# Patient Record
Sex: Female | Born: 1960 | Race: Black or African American | Marital: Married | Smoking: Never smoker
Health system: Southern US, Community
[De-identification: ages and names within clinical notes are randomized; demographics above are authoritative.]

---

## 2010-07-28 ENCOUNTER — Other Ambulatory Visit: Payer: Self-pay | Admitting: Internal Medicine

## 2010-07-28 DIAGNOSIS — E049 Nontoxic goiter, unspecified: Secondary | ICD-10-CM

## 2010-07-30 ENCOUNTER — Inpatient Hospital Stay: Admission: RE | Admit: 2010-07-30 | Payer: Self-pay | Source: Ambulatory Visit

## 2010-08-19 ENCOUNTER — Ambulatory Visit
Admission: RE | Admit: 2010-08-19 | Discharge: 2010-08-19 | Disposition: A | Discharge status: Home / Self Care | Payer: Medicaid Other | Source: Ambulatory Visit | Attending: Internal Medicine | Admitting: Internal Medicine

## 2010-08-19 DIAGNOSIS — E049 Nontoxic goiter, unspecified: Secondary | ICD-10-CM

## 2011-07-26 ENCOUNTER — Other Ambulatory Visit: Payer: Self-pay | Admitting: Internal Medicine

## 2011-07-26 DIAGNOSIS — Z1231 Encounter for screening mammogram for malignant neoplasm of breast: Secondary | ICD-10-CM

## 2011-08-17 ENCOUNTER — Ambulatory Visit: Payer: Medicaid Other

## 2011-08-25 ENCOUNTER — Ambulatory Visit
Admission: RE | Admit: 2011-08-25 | Discharge: 2011-08-25 | Disposition: A | Discharge status: Home / Self Care | Payer: Medicaid Other | Source: Ambulatory Visit | Attending: Internal Medicine | Admitting: Internal Medicine

## 2011-08-25 DIAGNOSIS — Z1231 Encounter for screening mammogram for malignant neoplasm of breast: Secondary | ICD-10-CM

## 2012-10-24 ENCOUNTER — Other Ambulatory Visit: Payer: Self-pay

## 2012-10-24 DIAGNOSIS — Z1231 Encounter for screening mammogram for malignant neoplasm of breast: Secondary | ICD-10-CM

## 2012-11-23 ENCOUNTER — Ambulatory Visit: Payer: Medicaid Other

## 2012-12-13 ENCOUNTER — Ambulatory Visit
Admission: RE | Admit: 2012-12-13 | Discharge: 2012-12-13 | Disposition: A | Discharge status: Home / Self Care | Payer: Medicaid Other | Source: Ambulatory Visit

## 2012-12-13 DIAGNOSIS — Z1231 Encounter for screening mammogram for malignant neoplasm of breast: Secondary | ICD-10-CM

## 2012-12-14 ENCOUNTER — Ambulatory Visit: Payer: Medicaid Other

## 2013-03-07 ENCOUNTER — Other Ambulatory Visit (HOSPITAL_COMMUNITY): Payer: Self-pay | Admitting: Internal Medicine

## 2013-03-07 ENCOUNTER — Ambulatory Visit (HOSPITAL_COMMUNITY)
Admission: RE | Admit: 2013-03-07 | Discharge: 2013-03-07 | Disposition: A | Discharge status: Home / Self Care | Payer: Medicaid Other | Source: Ambulatory Visit | Attending: Internal Medicine | Admitting: Internal Medicine

## 2013-03-07 DIAGNOSIS — M549 Dorsalgia, unspecified: Secondary | ICD-10-CM | POA: Insufficient documentation

## 2013-03-07 DIAGNOSIS — M545 Low back pain: Secondary | ICD-10-CM

## 2014-03-02 ENCOUNTER — Encounter (HOSPITAL_COMMUNITY): Payer: Self-pay | Admitting: Emergency Medicine

## 2014-03-02 ENCOUNTER — Emergency Department (HOSPITAL_COMMUNITY)
Admission: EM | Admit: 2014-03-02 | Discharge: 2014-03-03 | Disposition: A | Discharge status: Home / Self Care | Payer: Medicaid Other | Attending: Emergency Medicine | Admitting: Emergency Medicine

## 2014-03-02 DIAGNOSIS — R531 Weakness: Secondary | ICD-10-CM | POA: Diagnosis not present

## 2014-03-02 DIAGNOSIS — B349 Viral infection, unspecified: Secondary | ICD-10-CM | POA: Insufficient documentation

## 2014-03-02 DIAGNOSIS — R740 Nonspecific elevation of levels of transaminase and lactic acid dehydrogenase [LDH]: Secondary | ICD-10-CM | POA: Diagnosis not present

## 2014-03-02 DIAGNOSIS — Z79899 Other long term (current) drug therapy: Secondary | ICD-10-CM | POA: Insufficient documentation

## 2014-03-02 DIAGNOSIS — Z3202 Encounter for pregnancy test, result negative: Secondary | ICD-10-CM | POA: Insufficient documentation

## 2014-03-02 DIAGNOSIS — R7401 Elevation of levels of liver transaminase levels: Secondary | ICD-10-CM

## 2014-03-02 DIAGNOSIS — N390 Urinary tract infection, site not specified: Secondary | ICD-10-CM | POA: Insufficient documentation

## 2014-03-02 DIAGNOSIS — R509 Fever, unspecified: Secondary | ICD-10-CM | POA: Diagnosis present

## 2014-03-02 NOTE — ED Notes (Signed)
Pt arrived to the ED with a compliant of a fever with associated headache.  Pt states he went to the Doctor and was given medicine but is unaware of what the medicine is.  Pt states this regiment has not alleviated her symptoms.

## 2014-03-03 LAB — URINALYSIS, ROUTINE W REFLEX MICROSCOPIC
Bilirubin Urine: NEGATIVE
Glucose, UA: NEGATIVE mg/dL
Ketones, ur: NEGATIVE mg/dL
NITRITE: NEGATIVE
Protein, ur: NEGATIVE mg/dL
SPECIFIC GRAVITY, URINE: 1.011 (ref 1.005–1.030)
Urobilinogen, UA: 2 mg/dL — ABNORMAL HIGH (ref 0.0–1.0)
pH: 7.5 (ref 5.0–8.0)

## 2014-03-03 LAB — URINE MICROSCOPIC-ADD ON

## 2014-03-03 LAB — CBC WITH DIFFERENTIAL/PLATELET
BASOS PCT: 0 % (ref 0–1)
Basophils Absolute: 0 10*3/uL (ref 0.0–0.1)
EOS ABS: 0 10*3/uL (ref 0.0–0.7)
Eosinophils Relative: 1 % (ref 0–5)
HCT: 33.9 % — ABNORMAL LOW (ref 36.0–46.0)
HEMOGLOBIN: 11.1 g/dL — AB (ref 12.0–15.0)
Lymphocytes Relative: 18 % (ref 12–46)
Lymphs Abs: 1.2 10*3/uL (ref 0.7–4.0)
MCH: 25.3 pg — AB (ref 26.0–34.0)
MCHC: 32.7 g/dL (ref 30.0–36.0)
MCV: 77.2 fL — ABNORMAL LOW (ref 78.0–100.0)
MONOS PCT: 7 % (ref 3–12)
Monocytes Absolute: 0.5 10*3/uL (ref 0.1–1.0)
Neutro Abs: 5.1 10*3/uL (ref 1.7–7.7)
Neutrophils Relative %: 74 % (ref 43–77)
PLATELETS: 176 10*3/uL (ref 150–400)
RBC: 4.39 MIL/uL (ref 3.87–5.11)
RDW: 14.7 % (ref 11.5–15.5)
WBC: 6.9 10*3/uL (ref 4.0–10.5)

## 2014-03-03 LAB — COMPREHENSIVE METABOLIC PANEL
ALK PHOS: 108 U/L (ref 39–117)
ALT: 84 U/L — ABNORMAL HIGH (ref 0–35)
ANION GAP: 16 — AB (ref 5–15)
AST: 99 U/L — ABNORMAL HIGH (ref 0–37)
Albumin: 4 g/dL (ref 3.5–5.2)
BUN: 8 mg/dL (ref 6–23)
CO2: 24 mEq/L (ref 19–32)
CREATININE: 0.6 mg/dL (ref 0.50–1.10)
Calcium: 9.5 mg/dL (ref 8.4–10.5)
Chloride: 100 mEq/L (ref 96–112)
GFR calc Af Amer: >90 mL/min (ref >90)
GFR calc non Af Amer: >90 mL/min (ref >90)
Glucose, Bld: 101 mg/dL — ABNORMAL HIGH (ref 70–99)
POTASSIUM: 3.3 meq/L — AB (ref 3.7–5.3)
Sodium: 140 mEq/L (ref 137–147)
TOTAL PROTEIN: 8.3 g/dL (ref 6.0–8.3)
Total Bilirubin: 0.3 mg/dL (ref 0.3–1.2)

## 2014-03-03 LAB — LIPASE, BLOOD: LIPASE: 20 U/L (ref 11–59)

## 2014-03-03 LAB — PREGNANCY, URINE: PREG TEST UR: NEGATIVE

## 2014-03-03 MED ORDER — IBUPROFEN 800 MG PO TABS: 800.0000 mg | ORAL_TABLET | Freq: Three times a day (TID) | ORAL | Status: AC | PRN

## 2014-03-03 MED ORDER — METOCLOPRAMIDE HCL 5 MG/ML IJ SOLN
10.0000 mg | Freq: Once | INTRAMUSCULAR | Status: AC
  Administered 2014-03-03: 10 mg via INTRAVENOUS
  Filled 2014-03-03: qty 2

## 2014-03-03 MED ORDER — CEPHALEXIN 500 MG PO CAPS: 500.0000 mg | ORAL_CAPSULE | Freq: Two times a day (BID) | ORAL | Status: AC

## 2014-03-03 MED ORDER — KETOROLAC TROMETHAMINE 30 MG/ML IJ SOLN
30.0000 mg | Freq: Once | INTRAMUSCULAR | Status: AC
  Administered 2014-03-03: 30 mg via INTRAVENOUS
  Filled 2014-03-03: qty 1

## 2014-03-03 MED ORDER — SODIUM CHLORIDE 0.9 % IV BOLUS (SEPSIS)
1000.0000 mL | Freq: Once | INTRAVENOUS | Status: AC
  Administered 2014-03-03: 1000 mL via INTRAVENOUS

## 2014-03-03 MED ORDER — METOCLOPRAMIDE HCL 10 MG PO TABS: 10.0000 mg | ORAL_TABLET | Freq: Three times a day (TID) | ORAL | Status: AC | PRN

## 2014-03-03 MED ORDER — DIPHENHYDRAMINE HCL 50 MG/ML IJ SOLN
50.0000 mg | Freq: Once | INTRAMUSCULAR | Status: AC
  Administered 2014-03-03: 50 mg via INTRAVENOUS
  Filled 2014-03-03: qty 1

## 2014-03-03 NOTE — Discharge Instructions (Signed)
Viral Infections Ms. Desiree Lang, you were seen today for a viral infection. Her symptoms improved after medications. Take medications as prescribed. You also have a urine infection and you need to take antibiotics as prescribed.  The careful not to mix with your medicine given by her primary care physician, you do not know the name so we are unable to tell you if there is an interaction. To follow-up with her primary care doctor within 3 days for continued treatment, and also to workup your elevated liver enzymes. If any symptoms worsen come back to the emergency department immediately for repeat evaluation. He can take Tylenol and Motrin at home as needed for pain. Thank you. A viral infection can be caused by different types of viruses.Most viral infections are not serious and resolve on their own. However, some infections may cause severe symptoms and may lead to further complications. SYMPTOMS Viruses can frequently cause:  Minor sore throat.  Aches and pains.  Headaches.  Runny nose.  Different types of rashes.  Watery eyes.  Tiredness.  Cough.  Loss of appetite.  Gastrointestinal infections, resulting in nausea, vomiting, and diarrhea. These symptoms do not respond to antibiotics because the infection is not caused by bacteria. However, you might catch a bacterial infection following the viral infection. This is sometimes called a "superinfection." Symptoms of such a bacterial infection may include:  Worsening sore throat with pus and difficulty swallowing.  Swollen neck glands.  Chills and a high or persistent fever.  Severe headache.  Tenderness over the sinuses.  Persistent overall ill feeling (malaise), muscle aches, and tiredness (fatigue).  Persistent cough.  Yellow, green, or brown mucus production with coughing. HOME CARE INSTRUCTIONS   Only take over-the-counter or prescription medicines for pain, discomfort, diarrhea, or fever as directed by your  caregiver.  Drink enough water and fluids to keep your urine clear or pale yellow. Sports drinks can provide valuable electrolytes, sugars, and hydration.  Get plenty of rest and maintain proper nutrition. Soups and broths with crackers or rice are fine. SEEK IMMEDIATE MEDICAL CARE IF:   You have severe headaches, shortness of breath, chest pain, neck pain, or an unusual rash.  You have uncontrolled vomiting, diarrhea, or you are unable to keep down fluids.  You or your child has an oral temperature above 102 F (38.9 C), not controlled by medicine.  Your baby is older than 3 months with a rectal temperature of 102 F (38.9 C) or higher.  Your baby is 13 months old or younger with a rectal temperature of 100.4 F (38 C) or higher. MAKE SURE YOU:   Understand these instructions.  Will watch your condition.  Will get help right away if you are not doing well or get worse. Document Released: 01/13/2005 Document Revised: 06/28/2011 Document Reviewed: 08/10/2010 [facility] Patient Information 2015 [place], [place]. This information is not intended to replace advice given to you by your health care provider. Make sure you discuss any questions you have with your health care provider. Urinary Tract Infection A urinary tract infection (UTI) can occur any place along the urinary tract. The tract includes the kidneys, ureters, bladder, and urethra. A type of germ called bacteria often causes a UTI. UTIs are often helped with antibiotic medicine.  HOME CARE   If given, take antibiotics as told by your doctor. Finish them even if you start to feel better.  Drink enough fluids to keep your pee (urine) clear or pale yellow.  Avoid tea, drinks with caffeine, and  bubbly (carbonated) drinks.  Pee often. Avoid holding your pee in for a long time.  Pee before and after having sex (intercourse).  Wipe from front to back after you poop (bowel movement) if you are a woman. Use each tissue only  once. GET HELP RIGHT AWAY IF:   You have back pain.  You have lower belly (abdominal) pain.  You have chills.  You feel sick to your stomach (nauseous).  You throw up (vomit).  Your burning or discomfort with peeing does not go away.  You have a fever.  Your symptoms are not better in 3 days. MAKE SURE YOU:   Understand these instructions.  Will watch your condition.  Will get help right away if you are not doing well or get worse. Document Released: 09/22/2007 Document Revised: 12/29/2011 Document Reviewed: 11/04/2011 [facility] Patient Information 2015 [place], [place]. This information is not intended to replace advice given to you by your health care provider. Make sure you discuss any questions you have with your health care provider.    Emergency Department Resource Guide 1) Find a Doctor and Pay Out of Pocket Although you won't have to find out who is covered by your insurance plan, it is a good idea to ask around and get recommendations. You will then need to call the office and see if the doctor you have chosen will accept you as a new patient and what types of options they offer for patients who are self-pay. Some doctors offer discounts or will set up payment plans for their patients who do not have insurance, but you will need to ask so you aren't surprised when you get to your appointment.  2) Contact Your Local Health Department Not all health departments have doctors that can see patients for sick visits, but many do, so it is worth a call to see if yours does. If you don't know where your local health department is, you can check in your phone book. The CDC also has a tool to help you locate your state's health department, and many state websites also have listings of all of their local health departments.  3) Find a Walk-in Clinic If your illness is not likely to be very severe or complicated, you may want to try a walk in clinic. These are popping up all over the  country in pharmacies, drugstores, and shopping centers. They're usually staffed by nurse practitioners or physician assistants that have been trained to treat common illnesses and complaints. They're usually fairly quick and inexpensive. However, if you have serious medical issues or chronic medical problems, these are probably not your best option.  No Primary Care Doctor: - Call Health Connect at  [phone] - they can help you locate a primary care doctor that  accepts your insurance, provides certain services, etc. - Physician Referral Service- [phone]  Chronic Pain Problems: Organization         Address  Phone   Notes  [facility]  [phone] Patients need to be referred by their primary care doctor.   Medication Assistance: Organization         Address  Phone   Notes  [facility]ication [facility] [address] [place], [place] [phone] --Must be a resident of [place] [facility] [place] -- Must have NO insurance coverage whatsoever (no Medicaid/ Medicare, etc.) -- The pt. MUST have a primary care doctor that directs their care regularly and follows them in the community  Med[facility]ist  [phone]   [employer]  [phone]    Agencies that provide inexpensive medical care: Organization         Address  Phone   Notes  [facility]  [phone]   [facility]    [phone]   [facility] [address] [place], [place] [address] [phone]   [facility] [address] [place] [place] [phone]   Planned Parenthood    [phone]   [facility]    [phone]   [facility] and [facility]  [address], [place] Phone:  [phone], Fax:  [phone] Hours of Operation:  9 am - 6 pm, M-F.  Also accepts Medicaid/Medicare and self-pay.  [facility]  [address], [place] Phone: [phone], Fax: [phone]. Hours of Operation:  8:30 am - 5:30 pm, M-F.  Also accepts Medicaid and self-pay.  [facility] [place] [address], [place] Phone: [phone]   [facility] [address] Natasha Bence [place], [place] [phone], Ext. [phone] Mondays & Thursdays: 7-9 AM.  First 15 patients are seen on a first come, first serve basis.    Medicaid-accepting [facility] Providers:  Organization         Address  Phone   Notes  [facility] [address], [place] [phone] Also accepts self-pay patients.  [facility] [address] Laurell Josephs [place], [place]  [phone]   [facility] [address], [place] [phone]   [facility] [address] [place] [phone]   Renaye Rakers [address], [place]   [phone] Only accepts [place] Access [place] patients after they have their name applied to their card.   Self-Pay (no insurance) in [facility]:  Organization         Address  Phone   Notes  Sickle Cell Patients, [facility] [address] [place], [place] [phone]   [facility] Care [address] [place], [place] [phone]   [facility] [place]  [address], [place] [phone]   [facility]/Dr. Osei-Bonsu  [address] [place] or [address], [place] [phone] Phone number for both [place] and [place] locations is the same.  [facility] [address], [place] [phone]   [facility] [address], [place] or [address] [phone]   [facility] [address], [place] [phone], phone; [phone], fax Sees patients 1st and 3rd Saturday of every month.  Must  not qualify for public or private insurance (i.e. Medicaid, Medicare, [place] Health Choice, Veterans' Benefits)  Household income should be no more than 200% of the poverty level The clinic cannot treat you if you are pregnant or think you are pregnant  Sexually transmitted diseases are not treated at the clinic.    Dental Care: Organization         Address  Phone  Notes  [facility] Department of [facility] [address] [place], [place] [phone] Accepts children up to age 73 who are enrolled in [place] or [place] Health Choice;  pregnant women with a Medicaid card; and children who have applied for Medicaid or [place] Health Choice, but were declined, whose parents can pay a reduced fee at time of service.  [facility] of [facility] [place]  [address], [place] [phone] Accepts children up to age 52 who are enrolled in [place] or [place] Health Choice; pregnant women with a Medicaid card; and children who have applied for Medicaid or [place] Health Choice, but were declined, whose parents can pay a reduced fee at time of service.  [facility]  [address], [place] [phone] Patients are seen by appointment only. Walk-ins are not accepted. [facility] will see patients 52 years of age and older. Monday - Tuesday (8am-5pm) Most Wednesdays (8:30-5pm) $30 per visit, cash only  [facility]  [address], [facility] [phone] Patients are seen by appointment only. Walk-ins are not accepted. [facility] will see patients 818 years of age and older. One Wednesday Evening (Monthly: Volunteer Based).  $30 per visit, cash only  [facility]  [phone] for adults; Children under age 474, call Graduate Pediatric Dentistry at [phone]. Children aged 694-14, please call [phone] to request a pediatric application.  Dental services are  provided in all areas of dental care including fillings, crowns and bridges, complete and partial dentures, implants, gum treatment, root canals, and extractions. Preventive care is also provided. Treatment is provided to both adults and children. Patients are selected via a lottery and there is often a waiting list.   [facility] [address], [place]  [phone] [email or web address]   [facility] [address], [place], [place] [phone], Ext. [phone] Second and Fourth Thursday of each month, opens at 6:30 AM; Clinic ends at 9 AM.  Patients are seen on a first-come first-served basis, and a limited number are seen during each clinic.   [facility]  [address], [place], [place] [phone]   Eligibility Requirements You must have lived in [place], [place], or [place] counties for at least the last three months.   You cannot be eligible for state or federal sponsored National Cityhealthcare insurance, including CIGNAVeterans Administration, [place]icaid, or [employer]Medicare.   You generally cannot be eligible for healthcare insurance through your employer.    How to apply: Eligibility screenings are held every Tuesday and Wednesday afternoon from 1:00 pm until 4:00 pm. You do not need an appointment for the interview!  [facility] [address], [place], [place] [phone]   [facility]  [phone]   [facility]  [phone]   [facility]  [phone]    Behavioral Health Resources in the Community: Intensive Outpatient Programs Organization         Address  Phone  Notes  [facility] [address], [place], [place] [phone]   [facility] [address], [place], [place] [phone]   [facility] [address], [place], [place]  [phone]   [facility] [address],  [place], [place]  [phone] or [phone]   Substance Abuse Resources Organization         Address  Phone  Notes  [facility]  [phone]   [facility]  [phone]   [facility]  [phone]  Floydene Flock  [phone]   Residential & Outpatient Substance Abuse Program  [phone]   Psychological Services Organization         Address  Phone  Notes  [facility]  [phone]   [facility]  [phone]   [facility] [address] [place] [phone] or [phone]    Mobile Crisis Teams Organization         Address  Phone  Notes  [facility], Mobile Crisis Care Unit  [phone]   [facility]  [address] [place], [place] [phone]   Doristine Locks [address] [place] [phone]    Self-Help/Support Groups Organization         Address  Phone             Notes  [facility] [place] - variety of support groups  [phone]- [phone] Call for more information  Narcotics Anonymous (NA), [facility] [address], [employer] [place]  2 meetings at this location   Statistician         Address  Phone  Notes  [facility] [address],    [place]  [phone]   [facility]  [address], [place] [address], [place], [place] [phone]   [facility] [address] [place], [place] [phone] Admissions: 8am-3pm M-F  [facility] [address]    [place], [place] [phone]   [facility] [address] [place], [place], [place] [phone]   [facility] [address] [place] [address],  [place], [place] [phone]   [facility] - Intensive Outpatient [address] Laurell Josephs [address], [place], [place] [phone]   [facility] ([facility] [address] [place]  [place], [place] [phone] or  [phone]   [facility] ([facility]) [address], [place], [place] [phone] Accepts Medicaid  [facility] [address],  [place] [phone] Substance Abuse/Addiction Treatment   [facility] Organization         Address  Phone  Notes  [facility]  [phone]   Angie Fava, PhD [address] Ervin Knack [place], [place]   [phone] or [phone]   [facility]   [address] [place], [place] [phone]   [facility] [address], [place], [place] [phone] Insurance/Medicaid/sponsorship through [facility] [address]                                    [place], [place] [phone] Therapy/tele-psych/case  [facility] [address], [place] [phone]    Dr. Lolly Mustache  [phone]   [facility]  [employer] [facility] Dept. 1) [address] [place] 2) [address], [place] 3)  [address], [place] [phone]  [phone]   [facility] Child Abuse Hotline 9373912004 or [phone] (After Hours)

## 2014-03-03 NOTE — ED Provider Notes (Signed)
CSN: [id number]     Arrival date & time 03/02/14  2123 History   First MD Initiated Contact with Patient 03/03/14 0157     Chief Complaint  Patient presents with  . Fever  . Weakness     (Consider location/radiation/quality/duration/timing/severity/associated sxs/prior Treatment) HPI Desiree Lang is a 53 y.o. female with nosignificant past medical history. History was obtained via translator and her daughter in the room. She describes subjective fever for the last 3 days and chills.  She's also had diffuse body aches which has been going on for years and is not new. Patient describes a headache as well. She saw her primary care physician for this and was prescribed medication but cannot recall the name. Nothing has made her symptoms better or worse. It is now inhibiting her sleep.Over the last 3 days she's had decreased appetite as well. There's been no nausea vomiting or diarrhea. Patient has a history of constipation also been going on for years. She has one normal bowel movement per week. She denies any sick contacts. She's had no recent viral URI, chest pain, shortness of breath, or changes in her urine. Patient has no further complaints.    10 Systems reviewed and are negative for acute change except as noted in the HPI.     History reviewed. No pertinent past medical history. History reviewed. No pertinent past surgical history. History reviewed. No pertinent family history. History  Substance Use Topics  . Smoking status: Never Smoker   . Smokeless tobacco: Never Used  . Alcohol Use: No   OB History    No data available     Review of Systems    Allergies  Review of patient's allergies indicates no known allergies.  Home Medications   Prior to Admission medications   Medication Sig Start Date End Date Taking? Authorizing Provider  LINZESS 145 MCG CAPS capsule Take 145 mcg by mouth daily. 01/18/14  Yes Historical Provider, MD  NAPROXEN DR 500 MG EC tablet Take 500  mg by mouth 2 (two) times daily as needed. For pain. 12/30/13  Yes Historical Provider, MD  pantoprazole (PROTONIX) 40 MG tablet Take 40 mg by mouth daily. 12/30/13  Yes Historical Provider, MD   BP 159/99 mmHg  Pulse 101  Temp(Src) 100.6 F (38.1 C) (Oral)  Resp 15  Ht 5\' 6"  (1.676 m)  Wt 150 lb (68.04 kg)  BMI 24.22 kg/m2  SpO2 100% Physical Exam  Constitutional: She is oriented to person, place, and time. She appears well-developed and well-nourished. No distress.  HENT:  Head: Normocephalic and atraumatic.  Nose: Nose normal.  Mouth/Throat: Oropharynx is clear and moist. No oropharyngeal exudate.  Eyes: Conjunctivae and EOM are normal. Pupils are equal, round, and reactive to light. No scleral icterus.  Neck: Normal range of motion. Neck supple. No JVD present. No tracheal deviation present. No thyromegaly present.  Cardiovascular: Normal rate, regular rhythm and normal heart sounds.  Exam reveals no gallop and no friction rub.   No murmur heard. Pulmonary/Chest: Effort normal and breath sounds normal. No respiratory distress. She has no wheezes. She exhibits no tenderness.  Abdominal: Soft. Bowel sounds are normal. She exhibits no distension and no mass. There is no tenderness. There is no rebound and no guarding.  Musculoskeletal: Normal range of motion. She exhibits no edema or tenderness.  Lymphadenopathy:    She has no cervical adenopathy.  Neurological: She is alert and oriented to person, place, and time. No cranial nerve deficit. She exhibits normal  muscle tone.  Normal strength and sensation 4 extremity. Normal cerebellar and gait testing. Negative kernig and brud signs.  Skin: Skin is warm and dry. No rash noted. She is not diaphoretic. No erythema. No pallor.  Nursing note and vitals reviewed.   ED Course  Procedures (including critical care time) Labs Review Labs Reviewed  CBC WITH DIFFERENTIAL - Abnormal; Notable for the following:    Hemoglobin 11.1 (*)    HCT  33.9 (*)    MCV 77.2 (*)    MCH 25.3 (*)    All other components within normal limits  COMPREHENSIVE METABOLIC PANEL - Abnormal; Notable for the following:    Potassium 3.3 (*)    Glucose, Bld 101 (*)    AST 99 (*)    ALT 84 (*)    Anion gap 16 (*)    All other components within normal limits  URINALYSIS, ROUTINE W REFLEX MICROSCOPIC - Abnormal; Notable for the following:    APPearance CLOUDY (*)    Hgb urine dipstick SMALL (*)    Urobilinogen, UA 2.0 (*)    Leukocytes, UA TRACE (*)    All other components within normal limits  URINE MICROSCOPIC-ADD ON - Abnormal; Notable for the following:    Bacteria, UA MANY (*)    All other components within normal limits  LIPASE, BLOOD  PREGNANCY, URINE    Imaging Review No results found.   EKG Interpretation None      MDM   Final diagnoses:  None    Patient presents emergency department for fever, headache, diffuse body aches. Patient likely has a viral syndrome. We'll treat aggressively with IV fluids, Toradol, Reglan, Benadryl.  Patient has fever of 100.6, initial heart rate 101 in triage although I did not hear tachycardia my exam. Patient is meeting SIRS criteria with likely viral syndrome. We'll reassess patient.  Upon my repeat assessment of the patient, fever has resolved, is no tachycardia, patient states her headache has significantly improved. Currently awaiting urinalysis. If positive will treat for UTI.  Patient is requesting medication as a prescription, will give a limited course of motrin and reglan.   UA does reveal UTI.  PAtient given keflex for treatment, may be cause of her acute symptoms.  Likely not the cause of her body aches for many years however.  She is encouraged to follow the primary care physician regarding these symptoms as well as her mildly elevated LFTs. Patient and daughter demonstrated understanding, her vital signs remain within her normal limits and she is safe for discharge.    Tomasita CrumbleAdeleke Aribelle Mccosh,  MD 03/03/14 [phone]

## 2014-03-03 NOTE — ED Notes (Signed)
Urine collected.

## 2014-03-19 ENCOUNTER — Other Ambulatory Visit: Payer: Self-pay

## 2014-03-19 DIAGNOSIS — Z1231 Encounter for screening mammogram for malignant neoplasm of breast: Secondary | ICD-10-CM

## 2014-04-05 ENCOUNTER — Ambulatory Visit
Admission: RE | Admit: 2014-04-05 | Discharge: 2014-04-05 | Disposition: A | Discharge status: Home / Self Care | Payer: Medicaid Other | Source: Ambulatory Visit

## 2014-04-05 DIAGNOSIS — Z1231 Encounter for screening mammogram for malignant neoplasm of breast: Secondary | ICD-10-CM

## 2014-11-25 IMAGING — CR DG LUMBAR SPINE COMPLETE 4+V
5 series · 5 of 5 positions shown · non-contrast
Comparison: None.

CLINICAL DATA: Back pain left greater than right.

EXAM:
LUMBAR SPINE - COMPLETE 4+ VIEW

[t l-spine a.p.]
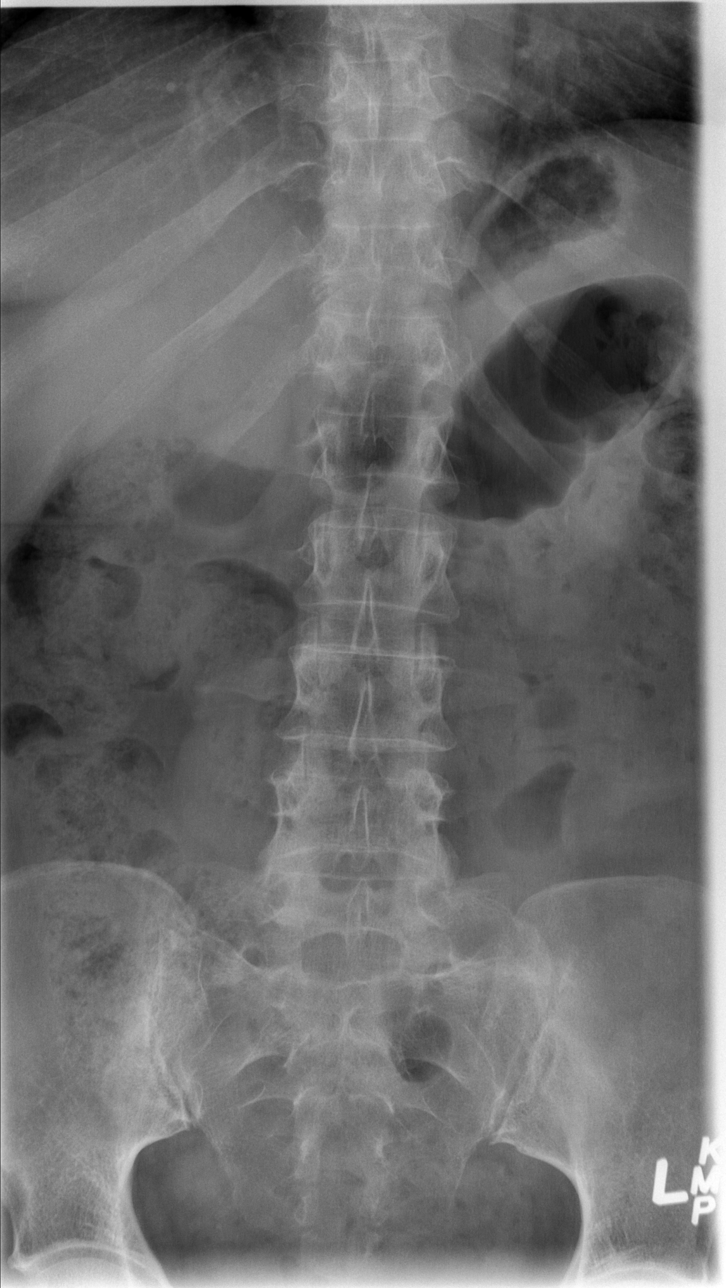

[t l-spine oblique exposure (1 of 2)]
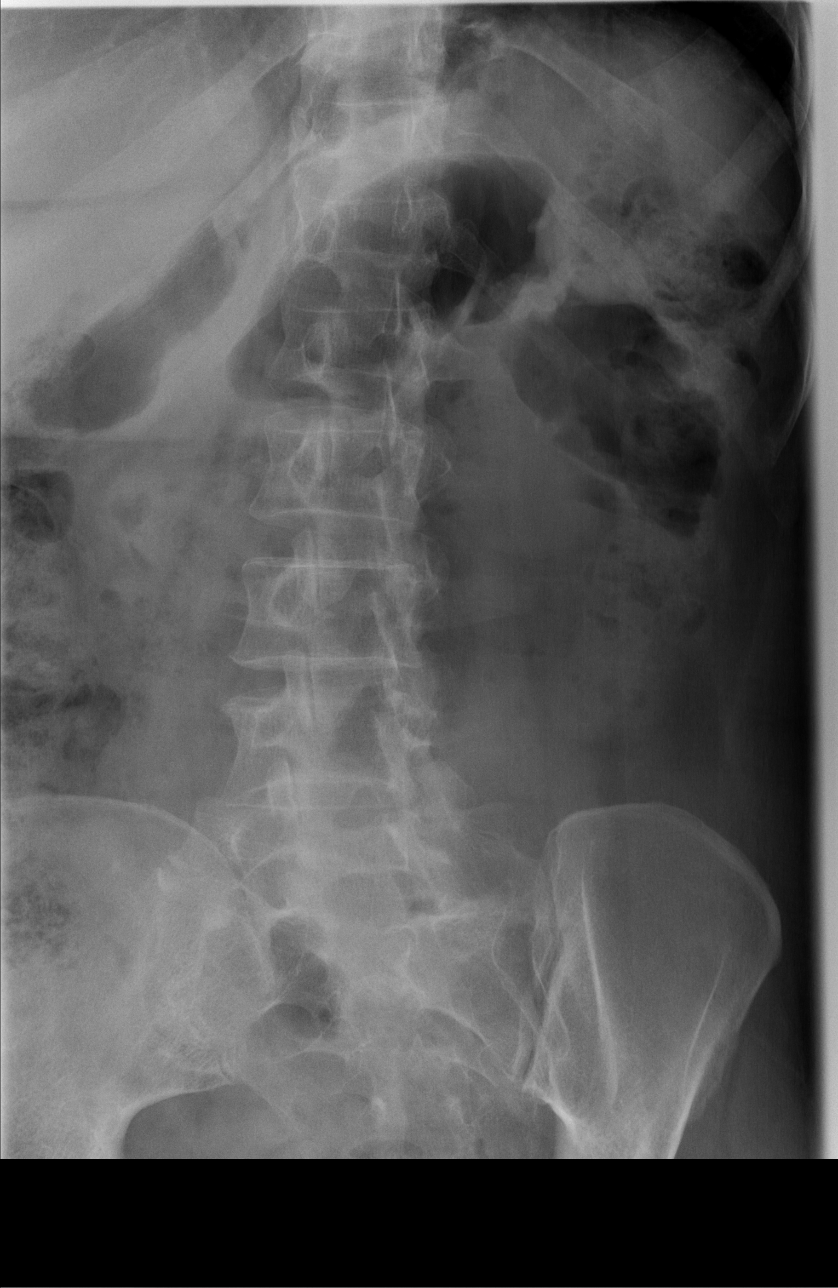

[t l-spine oblique exposure (2 of 2)]
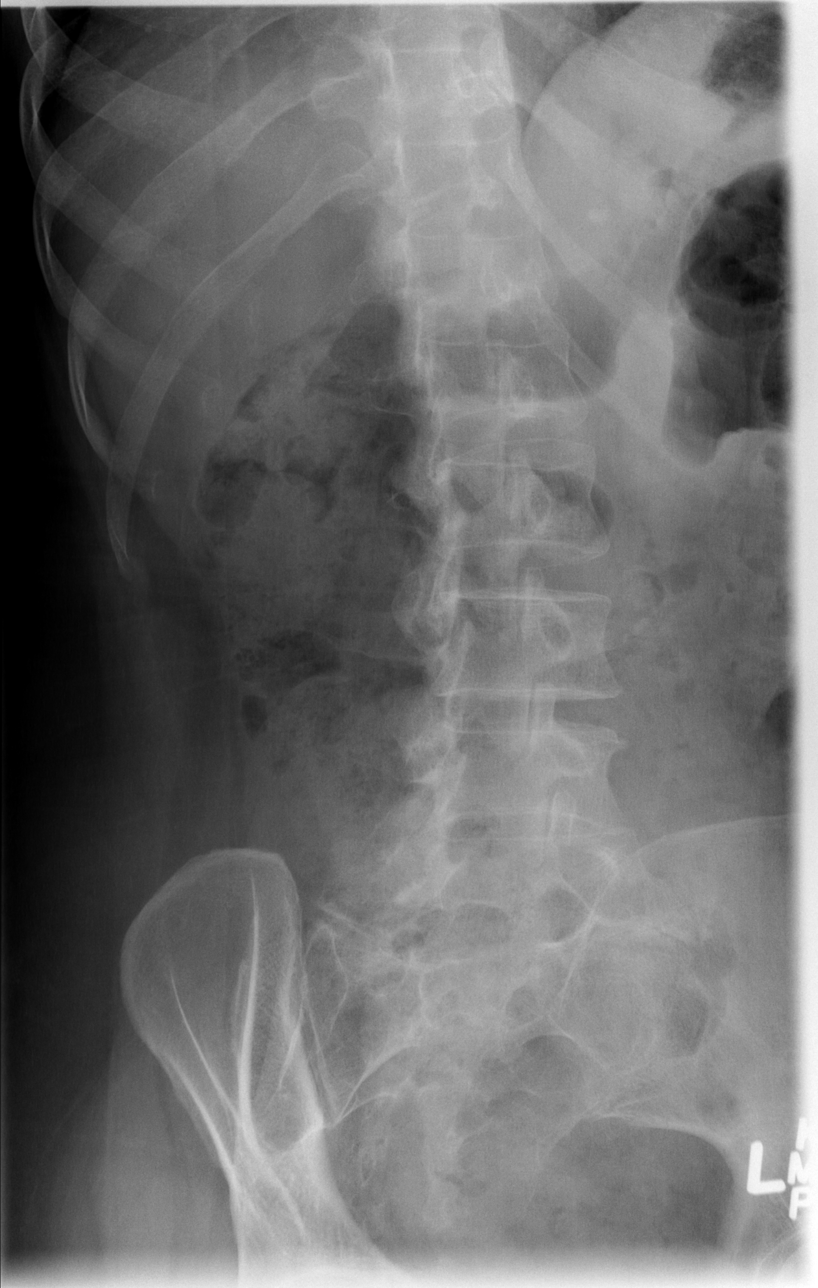

[t l-spine lat]
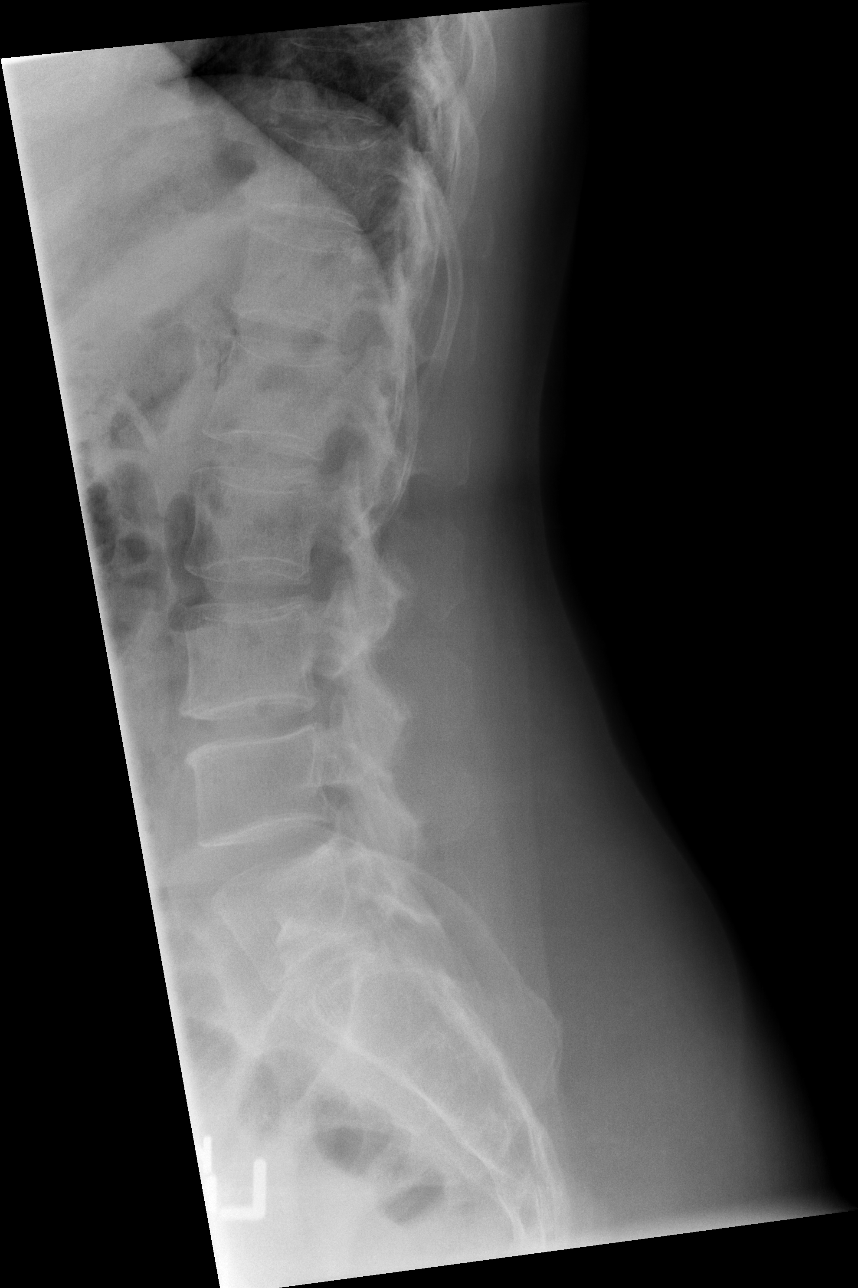

[t l-spine l5-s1 spot]
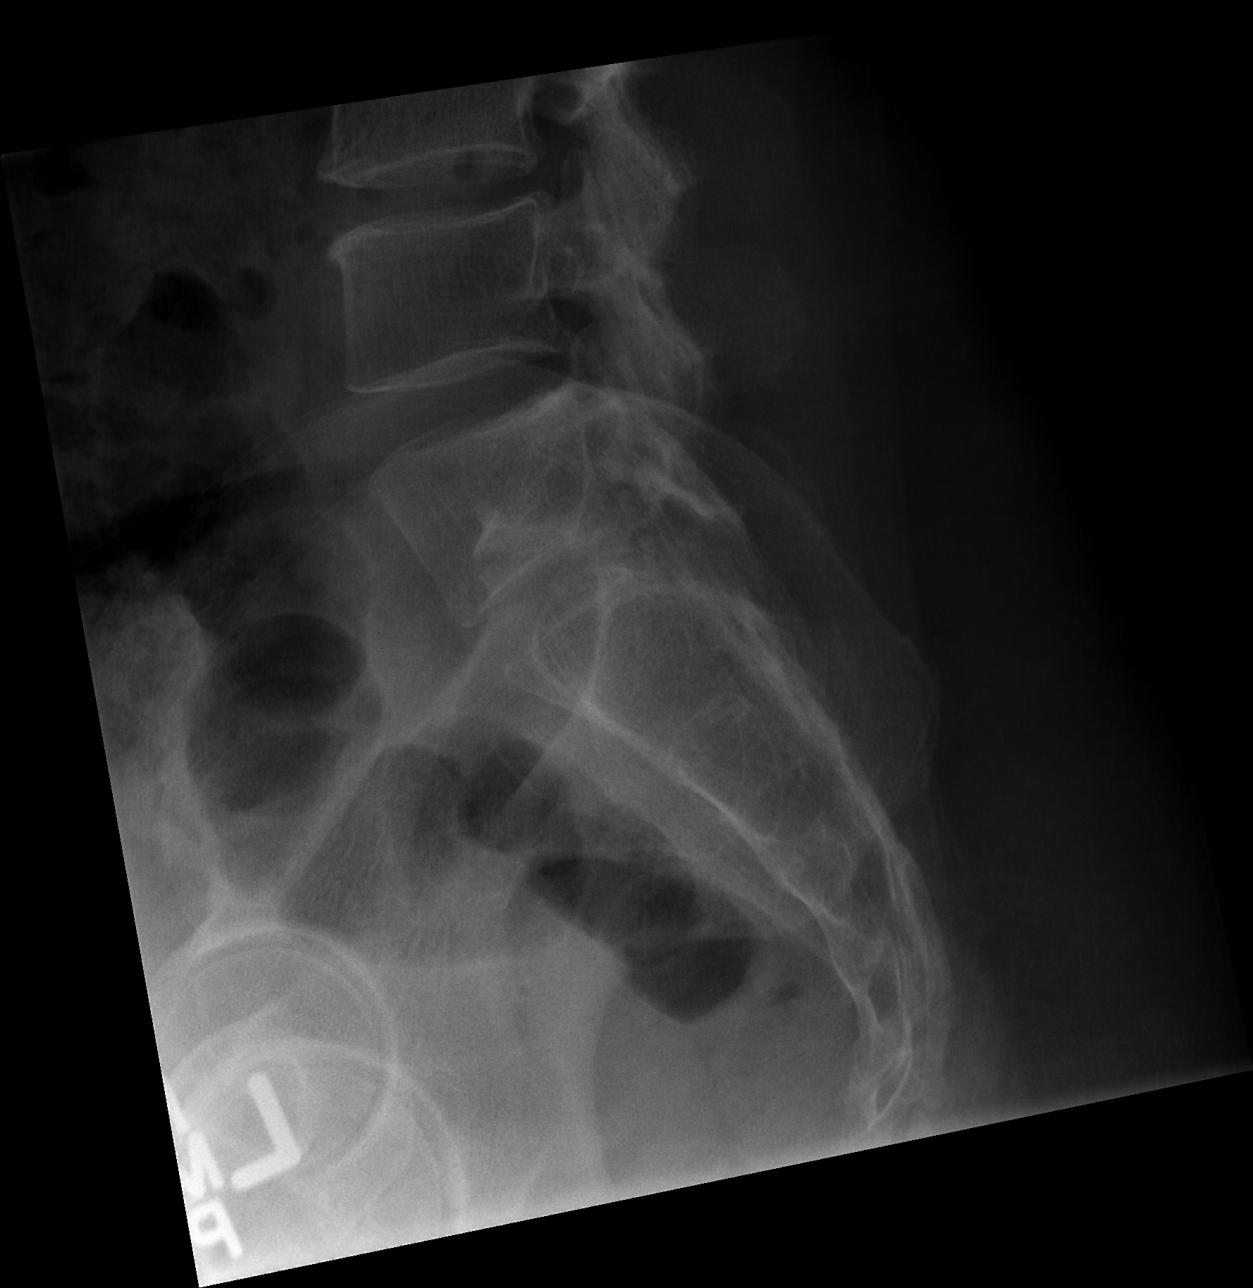

[5 of 5 positions shown; findings below may reference images not displayed]

FINDINGS: There is no evidence of lumbar spine fracture. Alignment is normal.
Intervertebral disc spaces are maintained.
IMPRESSION: Negative.
